# Patient Record
Sex: Male | Born: 1960 | Race: White | Hispanic: No | Marital: Single | Smoking: Current every day smoker
Health system: Southern US, Community
[De-identification: ages and names within clinical notes are randomized; demographics above are authoritative.]

---

## 2003-04-08 ENCOUNTER — Encounter: Admission: RE | Admit: 2003-04-08 | Discharge: 2003-04-08 | Payer: Self-pay | Admitting: Family Medicine

## 2005-06-14 ENCOUNTER — Encounter: Admission: RE | Admit: 2005-06-14 | Discharge: 2005-06-14 | Payer: Self-pay | Admitting: Occupational Medicine

## 2005-09-27 ENCOUNTER — Emergency Department (HOSPITAL_COMMUNITY): Admission: EM | Admit: 2005-09-27 | Discharge: 2005-09-28 | Payer: Self-pay | Admitting: Emergency Medicine

## 2010-01-23 ENCOUNTER — Encounter: Payer: Self-pay | Admitting: Family Medicine

## 2010-01-23 ENCOUNTER — Encounter: Payer: Self-pay | Admitting: Occupational Medicine

## 2011-01-12 ENCOUNTER — Ambulatory Visit (INDEPENDENT_AMBULATORY_CARE_PROVIDER_SITE_OTHER): Payer: Self-pay

## 2011-01-12 DIAGNOSIS — Z0489 Encounter for examination and observation for other specified reasons: Secondary | ICD-10-CM

## 2011-05-04 ENCOUNTER — Emergency Department (HOSPITAL_BASED_OUTPATIENT_CLINIC_OR_DEPARTMENT_OTHER)
Admission: EM | Admit: 2011-05-04 | Discharge: 2011-05-04 | Disposition: A | Discharge status: Home / Self Care | Payer: Worker's Compensation | Attending: Emergency Medicine | Admitting: Emergency Medicine

## 2011-05-04 ENCOUNTER — Encounter (HOSPITAL_BASED_OUTPATIENT_CLINIC_OR_DEPARTMENT_OTHER): Payer: Self-pay | Admitting: Emergency Medicine

## 2011-05-04 ENCOUNTER — Emergency Department (INDEPENDENT_AMBULATORY_CARE_PROVIDER_SITE_OTHER): Payer: Worker's Compensation

## 2011-05-04 DIAGNOSIS — S139XXA Sprain of joints and ligaments of unspecified parts of neck, initial encounter: Secondary | ICD-10-CM | POA: Insufficient documentation

## 2011-05-04 DIAGNOSIS — Y9289 Other specified places as the place of occurrence of the external cause: Secondary | ICD-10-CM | POA: Insufficient documentation

## 2011-05-04 DIAGNOSIS — X58XXXA Exposure to other specified factors, initial encounter: Secondary | ICD-10-CM

## 2011-05-04 DIAGNOSIS — S025XXA Fracture of tooth (traumatic), initial encounter for closed fracture: Secondary | ICD-10-CM

## 2011-05-04 DIAGNOSIS — S0181XA Laceration without foreign body of other part of head, initial encounter: Secondary | ICD-10-CM

## 2011-05-04 DIAGNOSIS — S0180XA Unspecified open wound of other part of head, initial encounter: Secondary | ICD-10-CM | POA: Insufficient documentation

## 2011-05-04 DIAGNOSIS — M542 Cervicalgia: Secondary | ICD-10-CM

## 2011-05-04 DIAGNOSIS — S161XXA Strain of muscle, fascia and tendon at neck level, initial encounter: Secondary | ICD-10-CM

## 2011-05-04 DIAGNOSIS — S0081XA Abrasion of other part of head, initial encounter: Secondary | ICD-10-CM

## 2011-05-04 DIAGNOSIS — M5030 Other cervical disc degeneration, unspecified cervical region: Secondary | ICD-10-CM

## 2011-05-04 DIAGNOSIS — W2209XA Striking against other stationary object, initial encounter: Secondary | ICD-10-CM | POA: Insufficient documentation

## 2011-05-04 IMAGING — CR DG CERVICAL SPINE COMPLETE 4+V
6 series · 6 of 6 positions shown · non-contrast
Comparison: None.

CLINICAL DATA: Neck pain status post trauma.

CERVICAL SPINE - COMPLETE 4+ VIEW

[w c-spine lat]
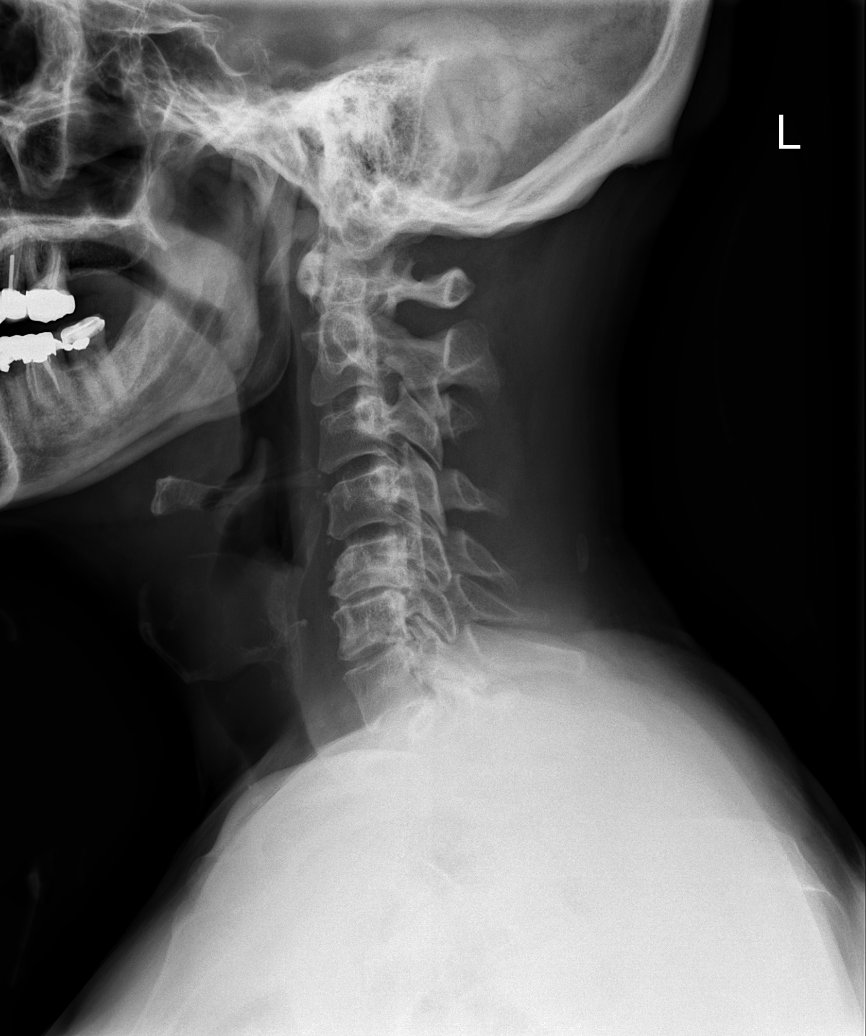

[w c-spine oblique (1 of 2)]
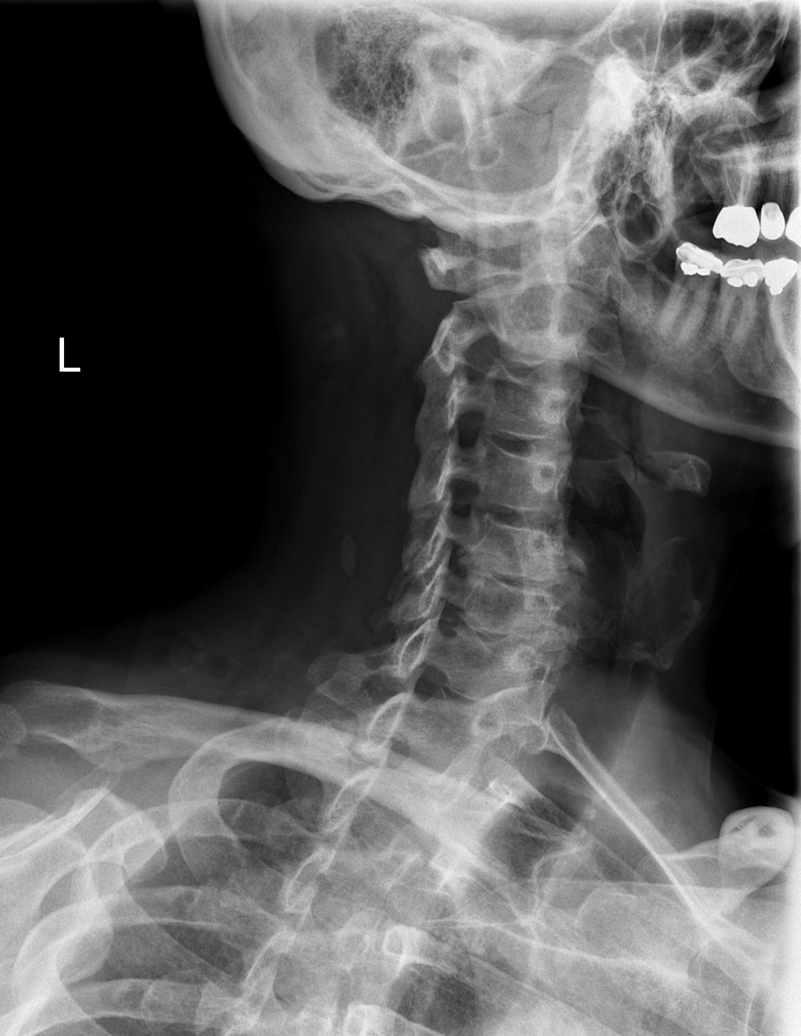

[w c-spine oblique (2 of 2)]
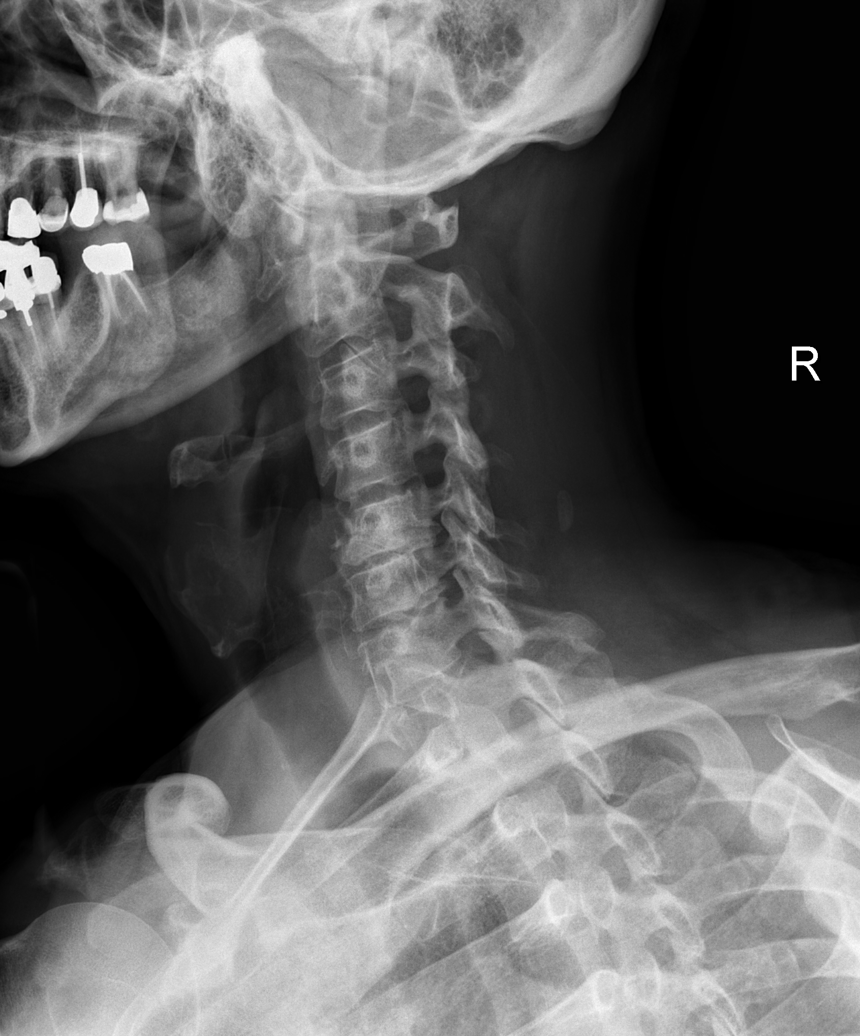

[w c-spine a.p.]
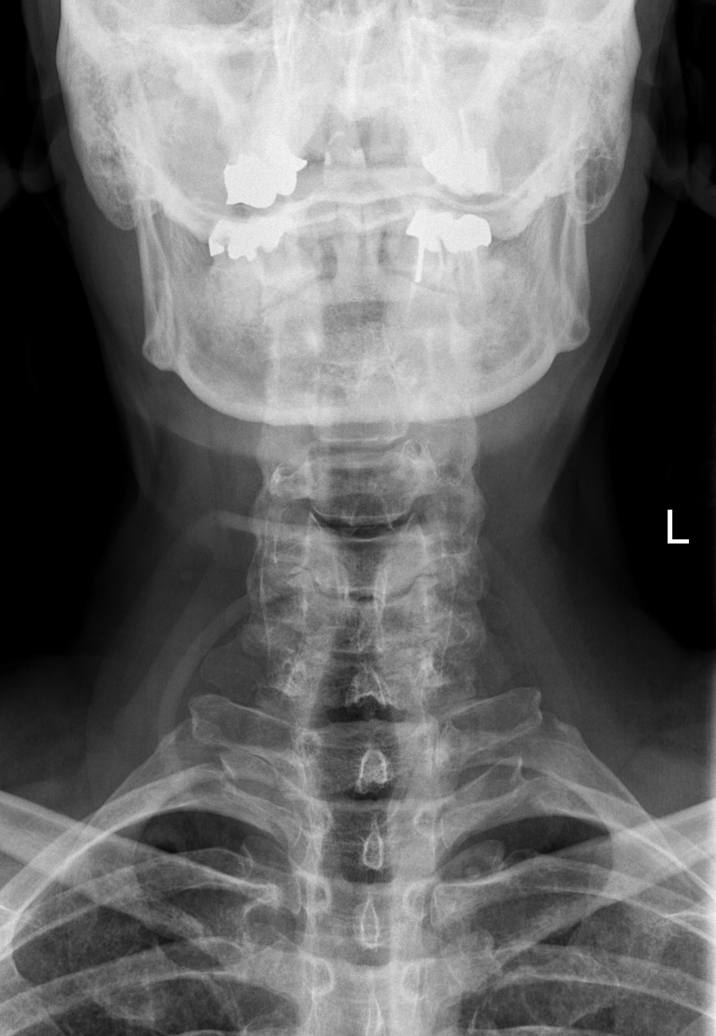

[w c-spine odontoid]
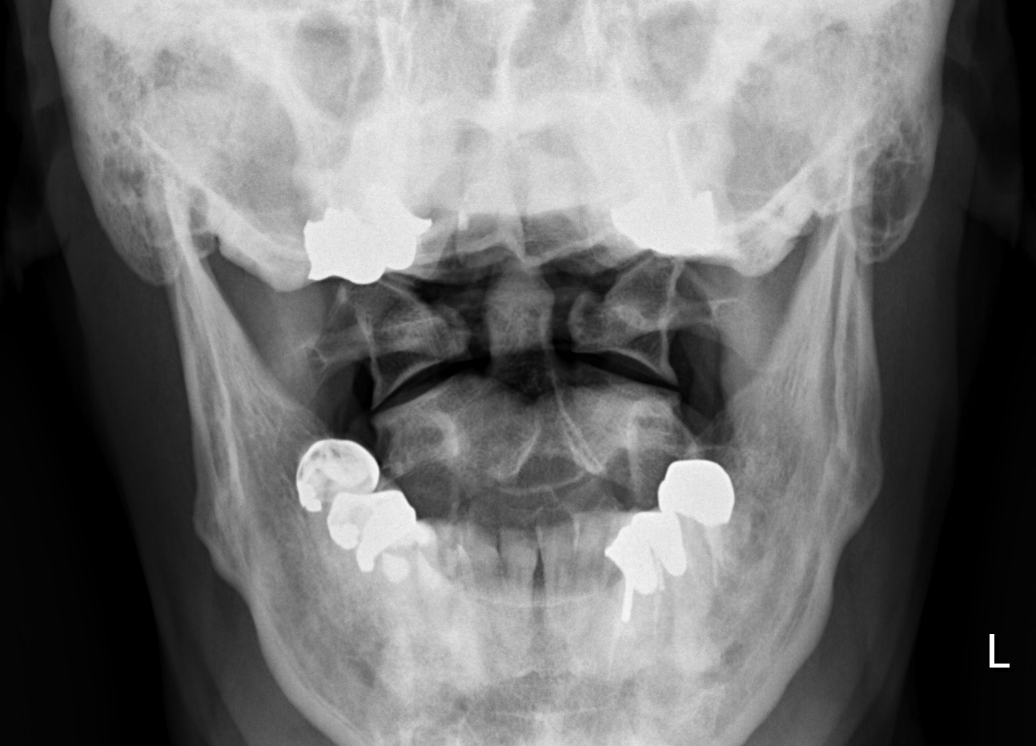

[w swimmers view]
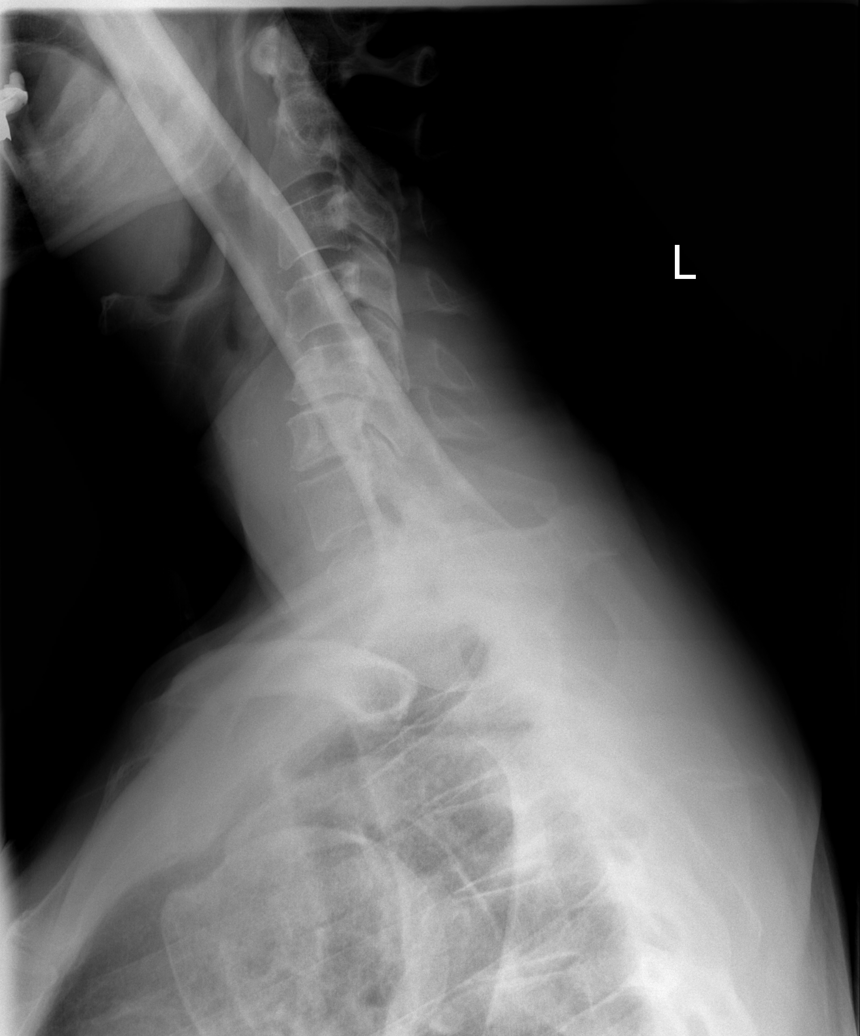

[6 of 6 positions shown; findings below may reference images not displayed]

FINDINGS: No acute fracture or dislocation identified.  There is
degenerative disc disease at C5-6.  No significant prevertebral
soft tissue swelling.  The upper lungs are clear.  Maintained C1-2
articulation.
IMPRESSION: Degenerative disc disease at C5-6.  No acute fracture or
dislocation identified.

## 2011-05-04 MED ORDER — BUPIVACAINE-EPINEPHRINE PF 0.5-1:200000 % IJ SOLN
INTRAMUSCULAR | Status: AC
  Filled 2011-05-04: qty 1.8

## 2011-05-04 MED ORDER — HYDROCODONE-ACETAMINOPHEN 5-325 MG PO TABS
1.0000 | ORAL_TABLET | Freq: Four times a day (QID) | ORAL | Status: AC | PRN
Start: 2011-05-04 — End: 2011-05-14

## 2011-05-04 MED ORDER — PENICILLIN V POTASSIUM 250 MG PO TABS
ORAL_TABLET | ORAL | Status: AC
  Filled 2011-05-04: qty 2

## 2011-05-04 MED ORDER — BUPIVACAINE-EPINEPHRINE PF 0.5-1:200000 % IJ SOLN
9.0000 mg | Freq: Once | INTRAMUSCULAR | Status: AC
  Administered 2011-05-04: 9 mg

## 2011-05-04 MED ORDER — PENICILLIN V POTASSIUM 250 MG PO TABS
500.0000 mg | ORAL_TABLET | Freq: Once | ORAL | Status: AC
  Administered 2011-05-04: 500 mg via ORAL

## 2011-05-04 MED ORDER — PENICILLIN V POTASSIUM 125 MG/5ML PO SOLR
500.0000 mg | Freq: Once | ORAL | Status: DC
  Filled 2011-05-04: qty 20

## 2011-05-04 MED ORDER — PENICILLIN V POTASSIUM 500 MG PO TABS: 500.0000 mg | ORAL_TABLET | Freq: Three times a day (TID) | ORAL | Status: AC

## 2011-05-04 MED ORDER — HYDROCODONE-ACETAMINOPHEN 5-325 MG PO TABS
1.0000 | ORAL_TABLET | Freq: Once | ORAL | Status: AC
  Administered 2011-05-04: 1 via ORAL
  Filled 2011-05-04: qty 1

## 2011-05-04 NOTE — ED Provider Notes (Addendum)
History     CSN: [id number]  Arrival date & time 05/04/11  0141   First MD Initiated Contact with Patient 05/04/11 [phone]      Chief Complaint  Patient presents with  . Head Injury    (Consider location/radiation/quality/duration/timing/severity/associated sxs/prior treatment) HPI Is a 51 year old white male who was at work this morning. He went up some stairs and his for head: And with a door that had not been raised properly. He snapped his neck back in the process. He complains of 2 wounds to his for head as well as a fracture of his right upper second incisor. There is severe pain associated with that fractured tooth. He is also having moderate pain in his neck. The c-collar was applied prior to my evaluation. He denies loss of consciousness, nausea or vomiting.  History reviewed. No pertinent past medical history.  History reviewed. No pertinent past surgical history.  No family history on file.  History  Substance Use Topics  . Smoking status: Current Everyday Smoker  . Smokeless tobacco: Not on file  . Alcohol Use: Yes      Review of Systems  All other systems reviewed and are negative.    Allergies  Morphine and related  Home Medications  No current outpatient prescriptions on file.  BP 133/97  Pulse 85  Temp(Src) 97.6 F (36.4 C) (Oral)  Resp 18  Wt 180 lb (81.647 kg)  SpO2 99%  Physical Exam General: Well-developed, well-nourished male in no acute distress; appearance consistent with age of record HENT: normocephalic, mid forehead abrasion, mid upper forehead laceration; no hemotympanum; Ellis 3 fracture of right upper second incisor Eyes: pupils equal round and reactive to light; extraocular muscles intact Neck: Immobilized in cervical collar; trachea midline without dysphonia Heart: regular rate and rhythm Lungs: clear to auscultation bilaterally Abdomen: soft; nondistended Extremities: No deformity; full range of motion Neurologic: Awake, alert  and oriented; motor function intact in all extremities and symmetric; no facial droop Skin: Warm and dry Psychiatric: Anxious    ED Course  Procedures (including critical care time)  LACERATION REPAIR Performed by: Irine Heminger L Authorized by: Hanley Seamen Consent: Verbal consent obtained. Risks and benefits: risks, benefits and alternatives were discussed Consent given by: patient Patient identity confirmed: provided demographic data Prepped and Draped in normal sterile fashion Wound explored  Laceration Location: Upper midforehead  Laceration Length: 1.4 cm  No Foreign Bodies seen or palpated  Anesthesia: None   Irrigation method: syringe Amount of cleaning: standard  Skin closure: Dermabond   Patient tolerance: Patient tolerated the procedure well with no immediate complications.   DYCAL APPLICATION An apical block of the patient's right upper second incisor was performed with 1.5 mL of 0.5% bupivacaine with epinephrine. Only partial anesthesia was obtained but the patient would not permit further attempts at anesthesia; he stated that the bupivacaine tasted so bad he would rather be in pain. An attempt was made to cover the exposed dentin and pulp with Dycal but the patient would not tolerate this and refused to allow further attempts. The exposed dentin and pulp were only partially occluded.  MDM  Nursing notes and vitals signs, including pulse oximetry, reviewed.  Summary of this visit's results, reviewed by myself:   Imaging Studies: Dg Cervical Spine Complete  05/04/2011  *RADIOLOGY REPORT*  Clinical Data: Neck pain status post trauma.  CERVICAL SPINE - COMPLETE 4+ VIEW  Comparison: None.  Findings: No acute fracture or dislocation identified.  There is degenerative disc disease at  C5-6.  No significant prevertebral soft tissue swelling.  The upper lungs are clear.  Maintained C1-2 articulation.  IMPRESSION: Degenerative disc disease at C5-6.  No acute fracture  or dislocation identified.  Original Report Authenticated By: Waneta Martins, M.D.   The patient will follow up with his dentist later this morning.          Hanley Seamen, MD 05/04/11 0424  Hanley Seamen, MD 05/04/11 [phone]

## 2011-05-04 NOTE — ED Notes (Signed)
Report given to Kellie Neal, RN  

## 2011-05-04 NOTE — ED Notes (Addendum)
Pt forehead bleeding , front tooth on right side out from hitting head,c/o of neck trauma

## 2011-05-04 NOTE — ED Notes (Signed)
Pt has laceration to forehead after hitting head on metal cargo door while at work.

## 2016-04-28 ENCOUNTER — Emergency Department (HOSPITAL_BASED_OUTPATIENT_CLINIC_OR_DEPARTMENT_OTHER)
Admission: EM | Admit: 2016-04-28 | Discharge: 2016-04-29 | Disposition: A | Discharge status: Home / Self Care | Payer: Worker's Compensation | Attending: Emergency Medicine | Admitting: Emergency Medicine

## 2016-04-28 ENCOUNTER — Encounter (HOSPITAL_BASED_OUTPATIENT_CLINIC_OR_DEPARTMENT_OTHER): Payer: Self-pay | Admitting: *Deleted

## 2016-04-28 DIAGNOSIS — Y990 Civilian activity done for income or pay: Secondary | ICD-10-CM | POA: Diagnosis not present

## 2016-04-28 DIAGNOSIS — Z23 Encounter for immunization: Secondary | ICD-10-CM | POA: Diagnosis not present

## 2016-04-28 DIAGNOSIS — F17200 Nicotine dependence, unspecified, uncomplicated: Secondary | ICD-10-CM | POA: Insufficient documentation

## 2016-04-28 DIAGNOSIS — Y9289 Other specified places as the place of occurrence of the external cause: Secondary | ICD-10-CM | POA: Diagnosis not present

## 2016-04-28 DIAGNOSIS — W298XXA Contact with other powered powered hand tools and household machinery, initial encounter: Secondary | ICD-10-CM | POA: Diagnosis not present

## 2016-04-28 DIAGNOSIS — Y9389 Activity, other specified: Secondary | ICD-10-CM | POA: Insufficient documentation

## 2016-04-28 DIAGNOSIS — S61012A Laceration without foreign body of left thumb without damage to nail, initial encounter: Secondary | ICD-10-CM | POA: Insufficient documentation

## 2016-04-28 NOTE — ED Triage Notes (Signed)
States that he works at [employer].  Reports that he drilled his finger with a drill bit at work.  No bleeding. No pain. States that his work requires that he comes in.

## 2016-04-29 MED ORDER — AMOXICILLIN-POT CLAVULANATE 875-125 MG PO TABS: 1.0000 | ORAL_TABLET | Freq: Two times a day (BID) | ORAL | 0 refills | Status: AC

## 2016-04-29 MED ORDER — TETANUS-DIPHTH-ACELL PERTUSSIS 5-2.5-18.5 LF-MCG/0.5 IM SUSP
0.5000 mL | Freq: Once | INTRAMUSCULAR | Status: AC
  Administered 2016-04-29: 0.5 mL via INTRAMUSCULAR
  Filled 2016-04-29: qty 0.5

## 2016-04-29 NOTE — ED Provider Notes (Signed)
[facility] Provider Note   CSN: [id number] Arrival date & time: 04/28/16  2217     History   Chief Complaint Chief Complaint  Patient presents with  . Extremity Laceration   Patient gave verbal permission to utilize photo for medical documentation only The image was not stored on any personal device  HPI Zachary Frye is a 56 y.o. male.  The history is provided by the patient.  Laceration   The incident occurred 3 to 5 hours ago. Pain location: left thumb. The pain is mild. The pain has been constant since onset. He reports no foreign bodies present. His tetanus status is unknown.   Pt works for [employer] He accidentally drilled into left thumb The drill bit was mostly clean, it may have had some oil on it Pt denies significant It did not go through the thumb entirely He can move thumb without difficulty   PMH - none Home Medications    Prior to Admission medications   Medication Sig Start Date End Date Taking? Authorizing Provider  amoxicillin-clavulanate (AUGMENTIN) 875-125 MG tablet Take 1 tablet by mouth 2 (two) times daily. One po bid x 7 days 04/29/16   Ripley Fraise, MD    Family History History reviewed. No pertinent family history.  Social History Social History  Substance Use Topics  . Smoking status: Current Every Day Smoker  . Smokeless tobacco: Not on file  . Alcohol use Yes     Allergies   Morphine and related   Review of Systems Review of Systems  Constitutional: Negative for fever.  Skin: Positive for wound.     Physical Exam Updated Vital Signs BP 121/74 (BP Location: Right Arm)   Pulse 87   Temp 98.2 F (36.8 C) (Oral)   Resp 20   Ht 5\' 7"  (1.702 m)   Wt 77.1 kg   SpO2 100%   BMI 26.63 kg/m   Physical Exam  CONSTITUTIONAL: Well developed/well nourished HEAD: Normocephalic/atraumatic ENMT: Mucous membranes moist NECK: supple no meningeal signs NEURO: Pt is awake/alert/appropriate, moves all  extremitiesx4. EXTREMITIES: pulses normal/equal, full ROM, no tenderness/crepitus to left thumb.  Nail intact.  He can fully range thumb without difficulty.   SKIN: warm, color normal, see photo PSYCH: no abnormalities of mood noted, alert and oriented to situation     Pt reports that he got blue ink on thumb after injuring thumb ED Treatments / Results  Labs (all labs ordered are listed, but only abnormal results are displayed) Labs Reviewed - No data to display  EKG  EKG Interpretation None       Radiology No results found.  Procedures Procedures (including critical care time)  Medications Ordered in ED Medications  Tdap (BOOSTRIX) injection 0.5 mL (0.5 mLs Intramuscular Given 04/29/16 0037)     Initial Impression / Assessment and Plan / ED Course  I have reviewed the triage vital signs and the nursing notes.      No need for imaging, no tenderness, full ROM of thumb noted.  Nail is intact Skin penetration is only minimal  He has already cleansed the wound This does not appear c/w high pressure injection wound Will start augmentin to help prevent any infection   Final Clinical Impressions(s) / ED Diagnoses   Final diagnoses:  Laceration of left thumb without foreign body without damage to nail, initial encounter    New Prescriptions Discharge Medication List as of 04/29/2016 12:43 AM    START taking these medications   Details  amoxicillin-clavulanate (AUGMENTIN) 875-125 MG tablet Take 1 tablet by mouth 2 (two) times daily. One po bid x 7 days, Starting Sat 04/29/2016, Print         Ripley Fraise, MD 04/29/16 [phone]

## 2016-04-29 NOTE — ED Notes (Signed)
Pt discharged to home NAD.  

## 2017-11-05 DIAGNOSIS — K4090 Unilateral inguinal hernia, without obstruction or gangrene, not specified as recurrent: Secondary | ICD-10-CM | POA: Diagnosis not present

## 2017-11-13 ENCOUNTER — Ambulatory Visit: Payer: Self-pay | Admitting: Surgery

## 2017-11-13 DIAGNOSIS — Z720 Tobacco use: Secondary | ICD-10-CM | POA: Diagnosis not present

## 2017-11-13 DIAGNOSIS — K429 Umbilical hernia without obstruction or gangrene: Secondary | ICD-10-CM | POA: Diagnosis not present

## 2017-11-13 DIAGNOSIS — K4020 Bilateral inguinal hernia, without obstruction or gangrene, not specified as recurrent: Secondary | ICD-10-CM | POA: Diagnosis not present

## 2017-11-16 DIAGNOSIS — D229 Melanocytic nevi, unspecified: Secondary | ICD-10-CM | POA: Diagnosis not present

## 2017-11-16 DIAGNOSIS — M25522 Pain in left elbow: Secondary | ICD-10-CM | POA: Diagnosis not present

## 2017-11-16 DIAGNOSIS — M25562 Pain in left knee: Secondary | ICD-10-CM | POA: Diagnosis not present

## 2017-11-26 DIAGNOSIS — M25562 Pain in left knee: Secondary | ICD-10-CM | POA: Diagnosis not present

## 2017-11-26 DIAGNOSIS — M25561 Pain in right knee: Secondary | ICD-10-CM | POA: Diagnosis not present

## 2017-11-26 DIAGNOSIS — M25522 Pain in left elbow: Secondary | ICD-10-CM | POA: Diagnosis not present

## 2017-12-04 ENCOUNTER — Other Ambulatory Visit: Payer: Self-pay | Admitting: Family Medicine

## 2017-12-04 DIAGNOSIS — R799 Abnormal finding of blood chemistry, unspecified: Secondary | ICD-10-CM | POA: Diagnosis not present

## 2017-12-04 DIAGNOSIS — M545 Low back pain, unspecified: Secondary | ICD-10-CM

## 2017-12-04 DIAGNOSIS — R319 Hematuria, unspecified: Secondary | ICD-10-CM

## 2017-12-04 DIAGNOSIS — K4090 Unilateral inguinal hernia, without obstruction or gangrene, not specified as recurrent: Secondary | ICD-10-CM | POA: Diagnosis not present

## 2017-12-05 ENCOUNTER — Ambulatory Visit
Admission: RE | Admit: 2017-12-05 | Discharge: 2017-12-05 | Disposition: A | Discharge status: Home / Self Care | Payer: 59 | Source: Ambulatory Visit | Attending: Family Medicine | Admitting: Family Medicine

## 2017-12-05 DIAGNOSIS — M545 Low back pain, unspecified: Secondary | ICD-10-CM

## 2017-12-05 DIAGNOSIS — N200 Calculus of kidney: Secondary | ICD-10-CM | POA: Diagnosis not present

## 2017-12-05 DIAGNOSIS — R319 Hematuria, unspecified: Secondary | ICD-10-CM

## 2017-12-05 DIAGNOSIS — K8020 Calculus of gallbladder without cholecystitis without obstruction: Secondary | ICD-10-CM | POA: Diagnosis not present

## 2017-12-05 MED ORDER — IOPAMIDOL (ISOVUE-300) INJECTION 61%
100.0000 mL | Freq: Once | INTRAVENOUS | Status: AC | PRN
  Administered 2017-12-05: 100 mL via INTRAVENOUS

## 2018-01-11 DIAGNOSIS — K429 Umbilical hernia without obstruction or gangrene: Secondary | ICD-10-CM | POA: Diagnosis not present

## 2018-01-11 DIAGNOSIS — K4030 Unilateral inguinal hernia, with obstruction, without gangrene, not specified as recurrent: Secondary | ICD-10-CM | POA: Diagnosis not present

## 2018-01-11 DIAGNOSIS — K4090 Unilateral inguinal hernia, without obstruction or gangrene, not specified as recurrent: Secondary | ICD-10-CM | POA: Diagnosis not present

## 2018-01-11 DIAGNOSIS — K4020 Bilateral inguinal hernia, without obstruction or gangrene, not specified as recurrent: Secondary | ICD-10-CM | POA: Diagnosis not present

## 2018-02-22 DIAGNOSIS — M25562 Pain in left knee: Secondary | ICD-10-CM | POA: Diagnosis not present

## 2018-02-22 DIAGNOSIS — M7712 Lateral epicondylitis, left elbow: Secondary | ICD-10-CM | POA: Diagnosis not present

## 2018-03-05 DIAGNOSIS — M25562 Pain in left knee: Secondary | ICD-10-CM | POA: Diagnosis not present

## 2018-03-05 DIAGNOSIS — M7712 Lateral epicondylitis, left elbow: Secondary | ICD-10-CM | POA: Diagnosis not present

## 2018-04-01 DIAGNOSIS — R0981 Nasal congestion: Secondary | ICD-10-CM | POA: Diagnosis not present

## 2018-04-01 DIAGNOSIS — H1032 Unspecified acute conjunctivitis, left eye: Secondary | ICD-10-CM | POA: Diagnosis not present

## 2018-04-10 DIAGNOSIS — M1712 Unilateral primary osteoarthritis, left knee: Secondary | ICD-10-CM | POA: Diagnosis not present

## 2018-04-10 DIAGNOSIS — M25522 Pain in left elbow: Secondary | ICD-10-CM | POA: Diagnosis not present

## 2018-04-10 DIAGNOSIS — M25562 Pain in left knee: Secondary | ICD-10-CM | POA: Diagnosis not present

## 2018-04-17 DIAGNOSIS — M1712 Unilateral primary osteoarthritis, left knee: Secondary | ICD-10-CM | POA: Diagnosis not present

## 2018-04-24 DIAGNOSIS — M1712 Unilateral primary osteoarthritis, left knee: Secondary | ICD-10-CM | POA: Diagnosis not present

## 2019-05-31 ENCOUNTER — Emergency Department (HOSPITAL_COMMUNITY): Admission: EM | Admit: 2019-05-31 | Discharge: 2019-05-31 | Discharge status: Absent without leave | Payer: 59

## 2019-05-31 NOTE — ED Notes (Signed)
Pt decided to leave AMA.
